# Patient Record
Sex: Male | Born: 1955 | Race: White | Hispanic: No | Marital: Married
Health system: Southern US, Community
[De-identification: ages and names within clinical notes are randomized; demographics above are authoritative.]

---

## 2009-05-03 ENCOUNTER — Ambulatory Visit: Payer: Self-pay | Admitting: Internal Medicine

## 2014-08-18 ENCOUNTER — Ambulatory Visit
Admit: 2014-08-18 | Disposition: A | Discharge status: Other Acute Inpatient Hospital | Payer: Self-pay | Attending: Family Medicine | Admitting: Family Medicine

## 2014-08-18 ENCOUNTER — Observation Stay
Admit: 2014-08-18 | Disposition: A | Discharge status: Home / Self Care | Payer: Self-pay | Attending: Internal Medicine | Admitting: Internal Medicine

## 2014-08-18 LAB — COMPREHENSIVE METABOLIC PANEL
ALK PHOS: 78 U/L
ALT: 27 U/L
ANION GAP: 6 — AB (ref 7–16)
Albumin: 4 g/dL
BUN: 13 mg/dL
Bilirubin,Total: 0.8 mg/dL
CO2: 25 mmol/L
CREATININE: 0.8 mg/dL
Calcium, Total: 8.4 mg/dL — ABNORMAL LOW
Chloride: 104 mmol/L
EGFR (African American): >60
EGFR (Non-African Amer.): >60
Glucose: 117 mg/dL — ABNORMAL HIGH
POTASSIUM: 3.9 mmol/L
SGOT(AST): 27 U/L
Sodium: 135 mmol/L
Total Protein: 7.1 g/dL

## 2014-08-18 LAB — URINALYSIS, COMPLETE
Bacteria: NONE SEEN
Bilirubin,UR: NEGATIVE
Blood: NEGATIVE
Glucose,UR: NEGATIVE mg/dL (ref 0–75)
Ketone: NEGATIVE
Leukocyte Esterase: NEGATIVE
Nitrite: NEGATIVE
PROTEIN: NEGATIVE
Ph: 5 (ref 4.5–8.0)
Specific Gravity: 1.02 (ref 1.003–1.030)

## 2014-08-18 LAB — APTT: ACTIVATED PTT: 31.4 s (ref 23.6–35.9)

## 2014-08-18 LAB — CBC
HCT: 42.8 % (ref 40.0–52.0)
HGB: 14.2 g/dL (ref 13.0–18.0)
MCH: 30.1 pg (ref 26.0–34.0)
MCHC: 33.1 g/dL (ref 32.0–36.0)
MCV: 91 fL (ref 80–100)
Platelet: 169 10*3/uL (ref 150–440)
RBC: 4.71 10*6/uL (ref 4.40–5.90)
RDW: 13.2 % (ref 11.5–14.5)
WBC: 5.3 10*3/uL (ref 3.8–10.6)

## 2014-08-18 LAB — SEDIMENTATION RATE: ERYTHROCYTE SED RATE: 9 mm/h (ref 0–20)

## 2014-08-18 LAB — PROTIME-INR
INR: 1
Prothrombin Time: 13.2 secs

## 2014-08-18 LAB — TROPONIN I

## 2014-08-19 LAB — BASIC METABOLIC PANEL
ANION GAP: 6 — AB (ref 7–16)
BUN: 13 mg/dL
CALCIUM: 8.5 mg/dL — AB
CO2: 27 mmol/L
Chloride: 104 mmol/L
Creatinine: 0.89 mg/dL
EGFR (African American): >60
EGFR (Non-African Amer.): >60
Glucose: 97 mg/dL
POTASSIUM: 4 mmol/L
Sodium: 137 mmol/L

## 2014-08-19 LAB — CBC WITH DIFFERENTIAL/PLATELET
BASOS ABS: 0 10*3/uL (ref 0.0–0.1)
Basophil %: 0.7 %
Eosinophil #: 0 10*3/uL (ref 0.0–0.7)
Eosinophil %: 1 %
HCT: 41.9 % (ref 40.0–52.0)
HGB: 14 g/dL (ref 13.0–18.0)
LYMPHS ABS: 0.6 10*3/uL — AB (ref 1.0–3.6)
Lymphocyte %: 13.7 %
MCH: 30.4 pg (ref 26.0–34.0)
MCHC: 33.3 g/dL (ref 32.0–36.0)
MCV: 91 fL (ref 80–100)
Monocyte #: 0.5 x10 3/mm (ref 0.2–1.0)
Monocyte %: 12.2 %
NEUTROS ABS: 3 10*3/uL (ref 1.4–6.5)
Neutrophil %: 72.4 %
Platelet: 160 10*3/uL (ref 150–440)
RBC: 4.61 10*6/uL (ref 4.40–5.90)
RDW: 13.4 % (ref 11.5–14.5)
WBC: 4.1 10*3/uL (ref 3.8–10.6)

## 2014-08-19 LAB — INFLUENZA A,B,H1N1 - PCR (ARMC)
H1N1 flu by pcr: NOT DETECTED
Influenza A By PCR: POSITIVE
Influenza B By PCR: NEGATIVE

## 2014-08-20 LAB — CBC WITH DIFFERENTIAL/PLATELET
BASOS PCT: 1 %
Basophil #: 0 10*3/uL (ref 0.0–0.1)
EOS ABS: 0.1 10*3/uL (ref 0.0–0.7)
Eosinophil %: 2.1 %
HCT: 43.7 % (ref 40.0–52.0)
HGB: 14.7 g/dL (ref 13.0–18.0)
LYMPHS ABS: 0.7 10*3/uL — AB (ref 1.0–3.6)
Lymphocyte %: 20.6 %
MCH: 30.5 pg (ref 26.0–34.0)
MCHC: 33.6 g/dL (ref 32.0–36.0)
MCV: 91 fL (ref 80–100)
MONO ABS: 0.5 x10 3/mm (ref 0.2–1.0)
Monocyte %: 14.7 %
NEUTROS ABS: 2.2 10*3/uL (ref 1.4–6.5)
NEUTROS PCT: 61.6 %
Platelet: 161 10*3/uL (ref 150–440)
RBC: 4.81 10*6/uL (ref 4.40–5.90)
RDW: 13.3 % (ref 11.5–14.5)
WBC: 3.6 10*3/uL — ABNORMAL LOW (ref 3.8–10.6)

## 2014-09-06 NOTE — H&P (Signed)
PATIENT NAME:  Adam Hanson, Adam Hanson MR#:  [id number] DATE OF BIRTH:  08/23/1955  DATE OF ADMISSION:  08/18/2014  PRIMARY PHYSICIAN: Marina Goodell, MD  EMERGENCY ROOM PHYSICIAN: Raelyn Ensign, MD   CHIEF COMPLAINT: Headache.   HISTORY OF PRESENT ILLNESS: This patient is a 59 year old male with history of hyperlipidemia and GERD, brought in because of headache. The patient has headache on the top of the head, around 10/10 in severity. It started yesterday and got progressively worse today, associated with some cough. The patient was playing golf and after that he started to have headache, and a because of severe headache he was brought in. The patient also noted to have confusion and also unstable gait. The patient's wife was driving him to the Emergency Room where he tried to reach the steering and he was having stumbling gait and disorientation. Concerning this headache, disorientation, unsteady gait, brought to the Emergency Room. Initial workup is negative. CT head is unremarkable. He was seen by Dr. Mellody Drown, he recommended MRI of the brain along with possible LP and EEG tomorrow morning. So, we are going to put him on observation status. The patient received morphine in the Emergency Room with some relief of headache. He denies blurred vision. No history of seizure disorder. There was some concern that he was having some shaking today, and the patient right now is not having any postictal symptoms. He is completely alert and oriented. The patient has been having severe cough since yesterday, and he thought it could be secondary to cough. The patient has no photophobia.  PAST MEDICAL HISTORY: Significant for hyperlipidemia and GERD.   MEDICATIONS: He is on Lipitor 40 mg daily and duloxetine 40 mg daily.   SOCIAL HISTORY: No smoking, no drinking, works as a Naval architect.   PAST SURGICAL HISTORY: No history of operations.   FAMILY HISTORY: No hypertension or diabetes.  REVIEW OF SYSTEMS:   CONSTITUTIONAL: No fever. No fatigue.  EYES: No blurred vision.  ENT: No tinnitus. No epistaxis.  RESPIRATORY: No cough. No wheezing.  CARDIOVASCULAR: No chest pain, orthopnea. No PND.  GASTROINTESTINAL: No nausea. No vomiting. No abdominal pain.  GENITOURINARY: No dysuria. No hematuria.  ENDOCRINE: No polyuria or nocturia.  HEMATOLOGIC: No anemia.  MUSCULOSKELETAL: No joint pain.  NEUROLOGIC: No numbness now. The patient does have headache but no weakness noted. No tremors. The patient does have ataxia and headache.   PHYSICAL EXAMINATION:  VITAL SIGNS: Temperature 98.8, heart rate 90, blood pressure 131/82, saturating is 98% on room air.  GENERAL: The patient is alert, awake, oriented; 59 year old obese male, not in distress. answering questions  appropriately. HEAD: Atraumatic, normocephalic.  EYES: Pupils equal, reacting to light. Extraocular movements intact.  No scleral icterus. No conjunctivitis.  ENT: No tinnitus, congestion. No turbinate hypertrophy. No oropharyngeal erythema. NECK: Supple. No JVD. No carotid bruits.  RESPIRATIONS: Clear to auscultation. No wheeze, no rales.  CARDIOVASCULAR: S1, S2; regular and rhythm. No murmurs. PMI not displaced with good pedal femoral pulse and good femoral pulse.  ABDOMEN: Soft, nontender, nondistended. Bowel sounds present.  MUSCULOSKELETAL: Strength 5/5 upper and lower extremities.  SKIN: No skin rashes.  NEUROLOGIC: Cranial nerves II-XII intact. DTRs 2+ bilaterally. No dysarthria or aphasia.  PSYCHIATRIC: Motor and affect are within normal limits.   LABORATORY DATA: Chest x-ray shows normal examination. Head CAT scan shows normal findings.   CBC: WBC 5.3, hemoglobin 14.2, hematocrit 42.8, platelets 169,000. The patient's electrolytes:  Sodium is 135, potassium 3.9, chloride 104,  bicarbonate 25, BUN 13, creatinine 0.80, glucose 117.   ASSESSMENT AND PLAN:  A 59 year old male patient with severe headache and altered mental status with  disorientation and ataxia, symptoms concerning for possible normal pressure hydrocephalus versus seizures. Differential also includes possible complicated migraine. His CT is normal. Seen by Dr. Mellody DrownMatthew Smith. We are going to admit him to hospitalist service under observation, get MRA of the brain along with EEG and follow neurological checks.  TIME SPENT: About 55 minutes.   ____________________________ Katha HammingSnehalatha Toran Murch, MD sk:TM D: 08/18/2014 18:45:24 ET T: 08/18/2014 20:33:02 ET JOB#: [id number]  cc: Katha HammingSnehalatha Rozetta Stumpp, MD, <Dictator> Katha HammingSNEHALATHA Maye Parkinson MD ELECTRONICALLY SIGNED 08/19/2014 15:17

## 2014-09-06 NOTE — Discharge Summary (Signed)
PATIENT NAME:  Adam Hanson, Adam Hanson MR#:  [id number] DATE OF BIRTH:  04/01/1956  DATE OF ADMISSION:  08/18/2014 DATE OF DISCHARGE:  08/20/2014  ADMITTING COMPLAINT:  Headache.   DISCHARGE DIAGNOSES:  1.  Influenza A infection.  2.  Headache.  3.  Hyperlipidemia.  4.  Gastroesophageal reflux disease.  5.  Depression.   CONSULTATIONS:  Dr. Mellody DrownMatthew Smith, neurology.   PROCEDURES:  1.  CT of the head without contrast shows normal exam.  No acute findings.  2.  Chest x-ray April 12 shows normal exam.  No pleural effusion, pneumothorax or other acute process.  3.  MRI of the brain without contrast shows no major intracranial arterial occlusion or high-grade proximal stenosis.  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  No definite intracranial aneurysm is identified.  4.  MR venogram of the brain or neck shows an unremarkable head MRV without evidence of venous sinus thrombosis.   HISTORY OF PRESENT ILLNESS: This 59 year old man with past medical history of hyperlipidemia and GERD comes in with a 10 out of 10 headache.  Headache started day prior to admission but became progressively worse with some cough noted.  He states he was playing golf and developed a headache.  He has had some confusion and unstable gait.  He had no other specific symptoms.  CT of the head and on presentation was negative.  He is admitted for further evaluation.  HOSPITAL COURSE BY PROBLEM:  Influenza A causing severe headache:  The patient had a CT scan and MRI, both of which were negative.  He also had an EEG which was negative for any seizure focus.  He was seen by neurology who, after a full workup including the aforementioned tests, suggested that we swab the patient for influenza.  His influenza swab was positive for influenza A and it is likely that this is the cause of his symptoms.  He has had a dry cough with no respiratory distress.  He has had a headache and mild body aches.  He was started  on Tamiflu and at the time of discharge, still feels pressure in the center of his forehead but is feeling overall much better.   DISCHARGE PHYSICAL EXAMINATION:  VITAL SIGNS:  Temperature 98.1, pulse 80, respirations 18, blood pressure 114/74, oxygenation 94% on room air.  GENERAL:  No acute distress.  CARDIOVASCULAR:  Regular rate and rhythm.  No murmurs, rubs, or gallops.  PULMONARY:  Lungs clear to auscultation bilaterally with good air movement.  No wheezes, rhonchi, or rales.  LABORATORY, EKG AND IMAGING DATA:  Sodium 137, potassium 4.0, chloride 104, bicarbonate 27, BUN 13, creatinine 0.89, glucose 97.  LFTs normal at the time of presentation.  Troponin less than 0.03. White blood cells 3.6, hemoglobin 14.7, platelets 161,000, MCV 91.  INR 1.1.  Influenza A is positive.  Urinalysis is negative for signs of infection.  EKG shows normal sinus rhythm.   DISCHARGE MEDICATIONS:  1.  Lipitor 40 mg 1 tablet daily.  2.  Duloxetine 40 mg 1 capsule once a day.  3.  Oseltamivir 75 mg 1 capsule every 12 hours x 5 days.  4.  Acetaminophen/butalbital/caffeine 325 mg/50 mg/40 mg oral capsule, take 2 capsules every 4  hours as needed for headache.   CONDITION ON DISCHARGE:  Stable.   DISPOSITION:  Discharged home with no home health requirements.   DIET: Low fat, low cholesterol diet.   ACTIVITY LIMITATIONS:  None.   TIMEFRAME FOR FOLLOWUP:  1.  Followup within 4 to 6 weeks with [facility].  2.  Follow up within 1 to 2 weeks with primary care.  OTHER INSTRUCTIONS:   1.  Stay hydrated.  2.  Use Tylenol every 6 hours as needed for pain or fever greater than 100.5.   TIME SPENT ON DISCHARGE:  40 minutes.     ____________________________ Ena Dawley. Clent Ridges, MD cpw:[id number] D: 08/24/2014 21:49:27 ET T: 08/24/2014 22:42:22 ET JOB#: [id number]  cc: Ena Dawley. Clent Ridges, MD, <Dictator> Gale Journey MD ELECTRONICALLY SIGNED 08/25/2014 15:08

## 2014-09-06 NOTE — Consult Note (Signed)
Referring Physician:  Raelyn EnsignQuale, [name] :   Primary Care Physician:   Raelyn EnsignQuale, [name] : [facility], [place], [place] [address], [phone]  Reason for Consult: Admit Date: 18-Aug-2014  Chief Complaint: headache  Reason for Consult: headache   History of Present Illness: History of Present Illness:   seen at request of Dr. Fanny BienQuale for headache;  59 yo RHD M [facility] secondary to severe headache 10/10 with nausea and vomiting.  Pt reports playing golf yesterday and not feeling right.  After that he was noted to have a mild headache and felt sleepy.  Pt slept all yesterday which is unusual for him.  He then woke up today with a 10/10 headache.  Headache was the top of the head and not sharp in nature.  There was no radiation noted.  Pt denies neck pain.  Pt reports feeling sleepy all day and overall fatigue.  He did have an episode where he went to sleep and does not remember everything.  He had some confusion as well but no focal weakness or numbness.  ROS:  General fatigue    HEENT no complaints    Lungs cough    Cardiac no complaints    GI no complaints    GU no complaints    Musculoskeletal no complaints    Extremities no complaints    Skin no complaints    Neuro headache    Endocrine no complaints    Psych no complaints    Past Medical/Surgical Hx:  Denies:   Denies:   Past Medical/ Surgical Hx:  Past Medical History HLD   Allergies:  Penicillin: Hives  Allergies:  Allergies PCN    Social/Family History: Employment Status: currently employed; Naval architecttruck driver  Lives With: spouse  Living Arrangements: house  Social History: oral tobacco, no EtOH, no illicits  Family History: no seizures, no stroke   Physical Exam: General: slightly overweight, mild distress  HEENT: normocephalic, sclera nonicteric, oropharynx clear  Neck: supple, no JVD, no bruits;  neg Brudinski and neg Kernig  Chest: CTA B, no wheezing,  good movement  Cardiac: tachycardic, no murmur, 2+ pulses  Extremities: no C/C/E, FROM   Neurologic Exam: Mental Status: alert and oriented x 2 not time, normal speech and language, follows complex commands  Cranial Nerves: PERRLA, EOMI, nl VF, face symmetric, tongue midline, shoulder shrug equal  Motor Exam: 5/5 B normal, tone, no tremor  Deep Tendon Reflexes: 2+/4 B, plantars downgoing B, no Hoffman  Sensory Exam: pinprick, temperature, and vibration intact B  Coordination: FTN and HTS WNL, nl RAM, nl gait   Lab Results:  Routine Coag:  12-Apr-16 16:24   Activated PTT (APTT) 31.4 (A HCT value >55% may artifactually increase the APTT. In one study, the increase was an average of 19%. Reference: "Effect on Routine and Special Coagulation Testing Values of Citrate Anticoagulant Adjustment in Patients with High HCT Values." American Journal of Clinical Pathology 2006;126:400-405.)  Prothrombin 13.2 (11.4-15.0 NOTE: New Reference Range  06/05/14)  INR 1.0 (INR reference interval applies to patients on anticoagulant therapy. A single INR therapeutic range for coumarins is not optimal for all indications; however, the suggested range for most indications is 2.0 - 3.0. Exceptions to the INR Reference Range may include: Prosthetic heart valves, acute myocardial infarction, prevention of myocardial infarction, and combinations of aspirin and anticoagulant. The need for a higher or lower target INR must be assessed individually. Reference: The Pharmacology and Management  of the Vitamin K  antagonists: the seventh ACCP Conference on Antithrombotic and Thrombolytic Therapy. Chest.2004 Sept:126 (3suppl): L7870634. A HCT value >55% may artifactually increase the PT.  In one study,  the increase was an average of 25%. Reference:  "Effect on Routine and Special Coagulation Testing Values of Citrate Anticoagulant Adjustment in Patients with High HCT Values." American Journal of Clinical  Pathology 2006;126:400-405.)  Routine Hem:  12-Apr-16 16:24   WBC (CBC) 5.3  RBC (CBC) 4.71  Hemoglobin (CBC) 14.2  Hematocrit (CBC) 42.8  Platelet Count (CBC) 169 (Result(s) reported on 18 Aug 2014 at 05:03PM.)  MCV 91  MCH 30.1  MCHC 33.1  RDW 13.2   Radiology Results: CT:    12-Apr-16 16:28, CT Head Without Contrast  CT Head Without Contrast   REASON FOR EXAM:    SUDDEN, severe, worst headache of life started last   PM, speech thick today. STAT  COMMENTS:   May transport without cardiac monitor    PROCEDURE: CT  - CT HEAD WITHOUT CONTRAST  - Aug 18 2014  4:28PM     CLINICAL DATA:  Severe frontal headache since last night, code  stroke, sudden onset of severe worst headache of life    EXAM:  CT HEAD WITHOUT CONTRAST    TECHNIQUE:  Contiguous axial images were obtained from the base of the skull  through the vertex without intravenous contrast.  COMPARISON:  None    FINDINGS:  Normal ventricular morphology.    No midline shift or mass effect.    Normal appearance of brain parenchyma.    No intracranial hemorrhage, mass lesion, or acute infarction.    Visualized paranasal sinuses and mastoid air cells clear.    Bones unremarkable.   IMPRESSION:  Normal exam.    Findings called to Dr. Fanny Bien on 08/18/2014 at 1635 hours.      Electronically Signed    By: Ulyses Southward M.D.    On: 08/18/2014 16:35         Verified By: Lollie Marrow, M.D.,   Radiology Impression: Radiology Impression: CT of head personally reviewed by me and is normal   Impression/Recommendations: Recommendations:   labs reviewed by me d/w Dr. Fanny Bien   Headache-  currently there is concern for secondary cause which is mainly seizure given loss of memory and behavior changes;  this could also be a subarachnoid hemorrhage with neg CT of head however there are no menigeal signs to support this;  r/o brain mass as well MRI of brain MRA/V of brain EEG toradol  IV now, magnesium 1gm IV now  and reglan  IV now continue IV fluids at 75cc/hr will follow tomorrow but may be able to go home if w/u neg  Electronic Signatures: Meryl Dare (MD)  (Signed 12-Apr-16 17:48)  Authored: REFERRING PHYSICIAN, Primary Care Physician, Consult, History of Present Illness, Review of Systems, PAST MEDICAL/SURGICAL HISTORY, HOME MEDICATIONS, ALLERGIES, Social/Family History, Physical Exam-, LAB RESULTS, RADIOLOGY RESULTS, Recommendations   Last Updated: 12-Apr-16 17:48 by Meryl Dare (MD)

## 2015-12-24 IMAGING — CT CT HEAD WITHOUT CONTRAST
1 series · 16 of 30 positions shown, 20 images · non-contrast
Comparison: None

CLINICAL DATA: Severe frontal headache since last night, code
stroke, sudden onset of severe worst headache of life

EXAM:
CT HEAD WITHOUT CONTRAST
TECHNIQUE: Contiguous axial images were obtained from the base of the skull
through the vertex without intravenous contrast.

[Series 2: head wo · axial · 0.41mm/px · z∈[-618,-478]mm · 16 of 32 slices shown, 20 images]
[im 2/32  brain]
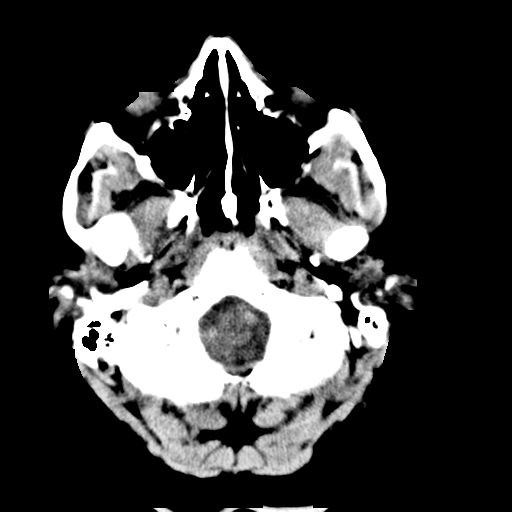
[im 2/32  bone]
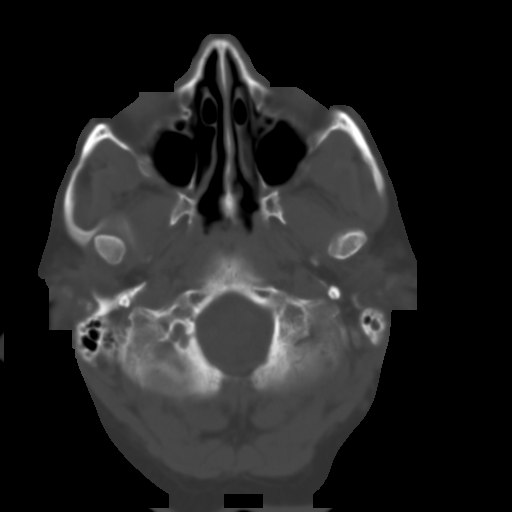
[im 4/32  brain]
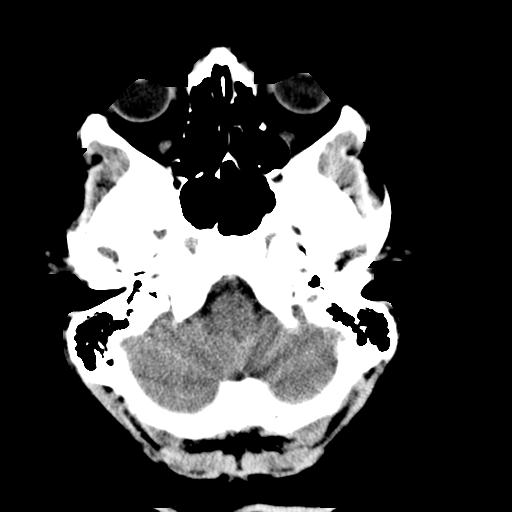
[im 6/32  brain]
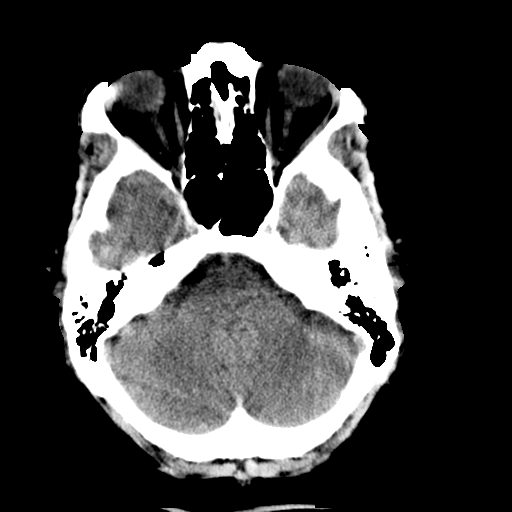
[im 8/32  brain]
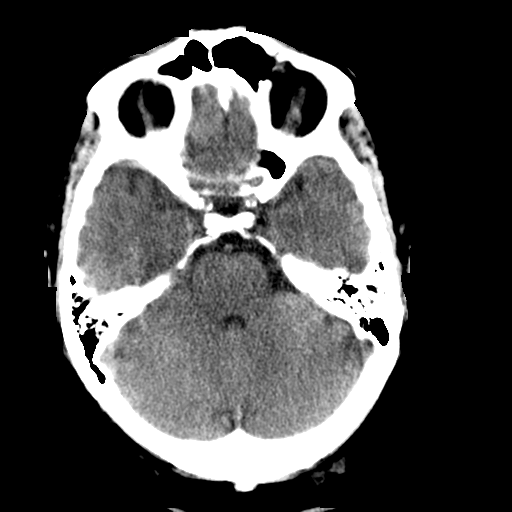
[im 9/32  brain]
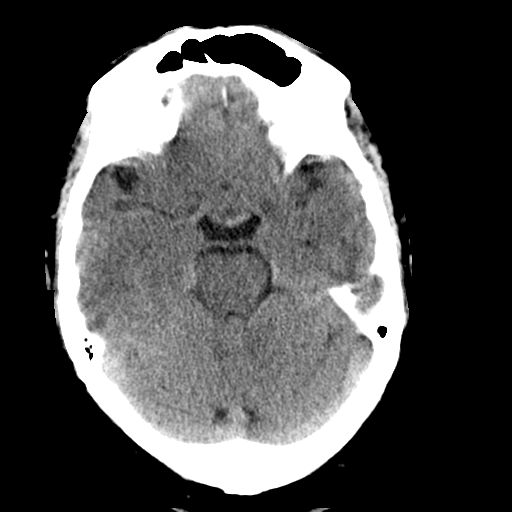
[im 9/32  bone]
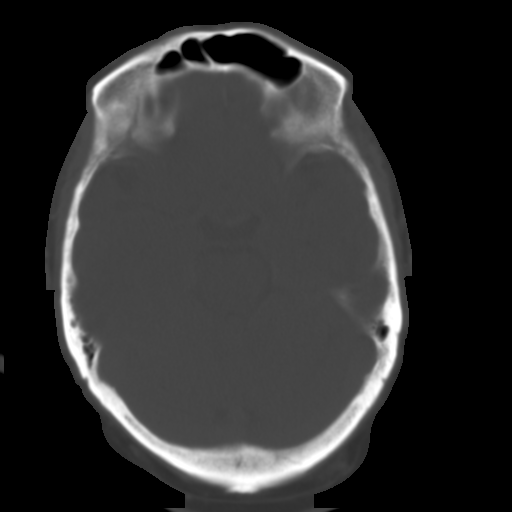
[im 11/32  brain]
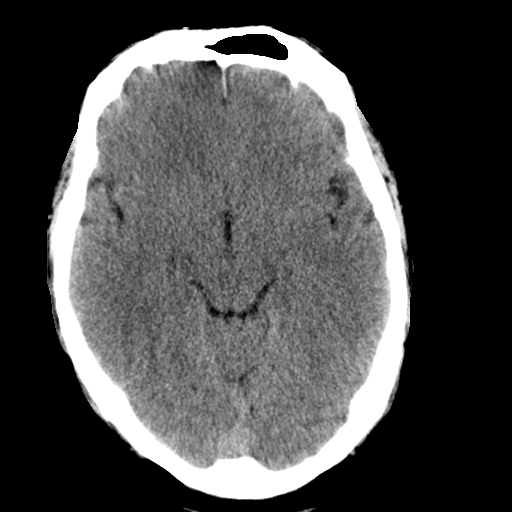
[im 13/32  brain]
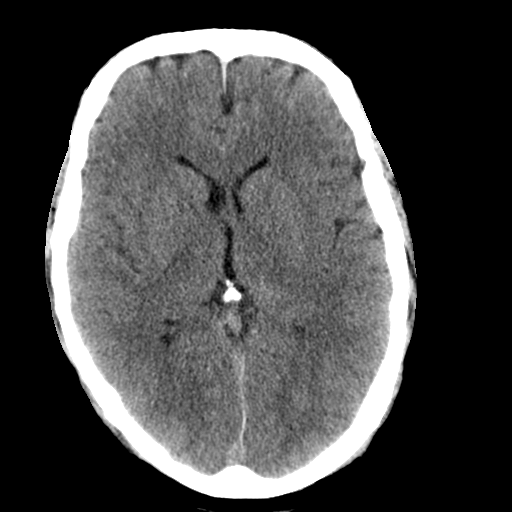
[im 15/32  brain]
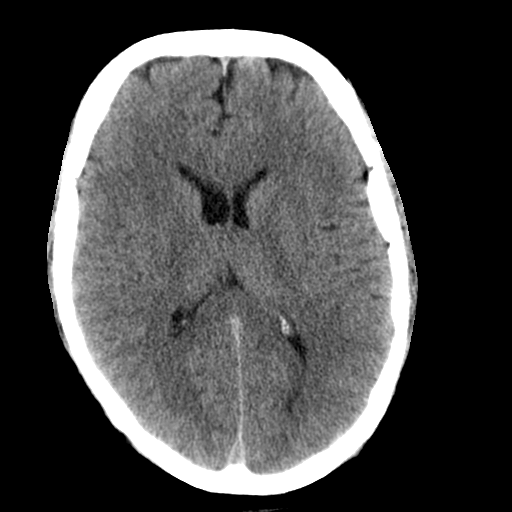
[im 17/32  brain]
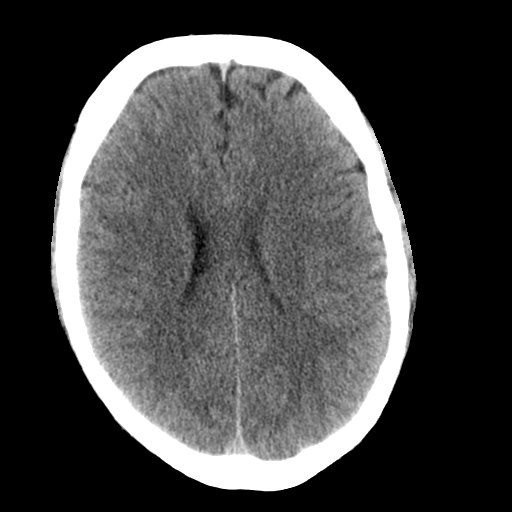
[im 17/32  bone]
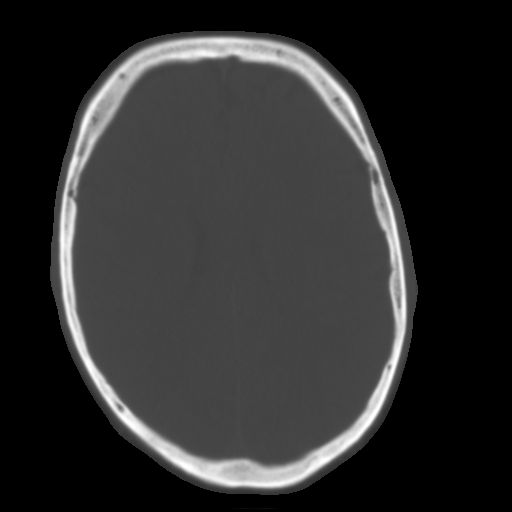
[im 19/32  brain]
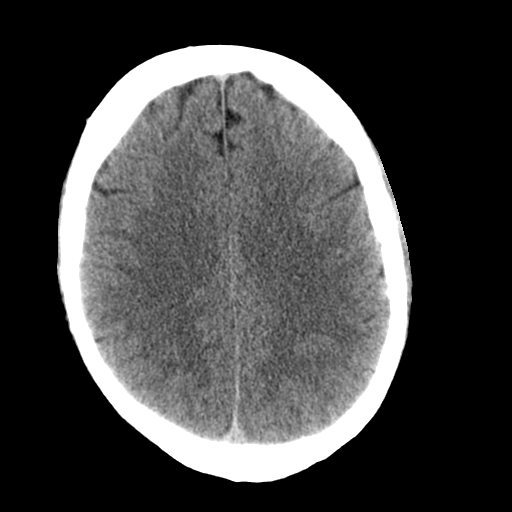
[im 21/32  brain]
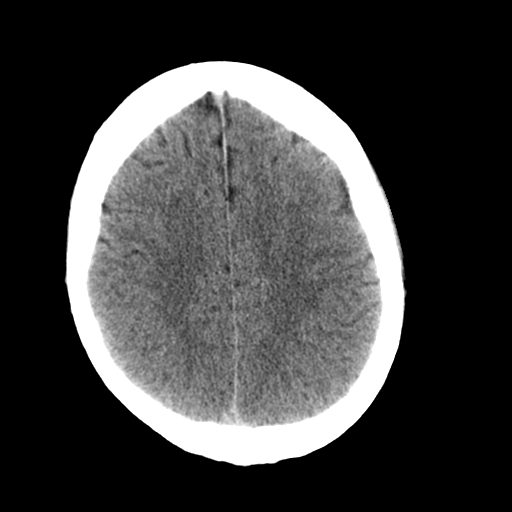
[im 23/32  brain]
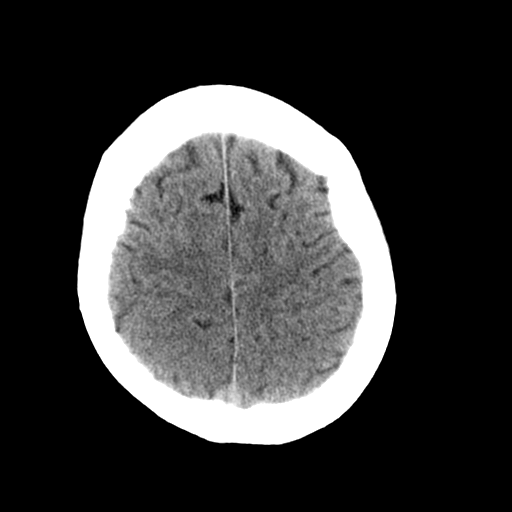
[im 24/32  brain]
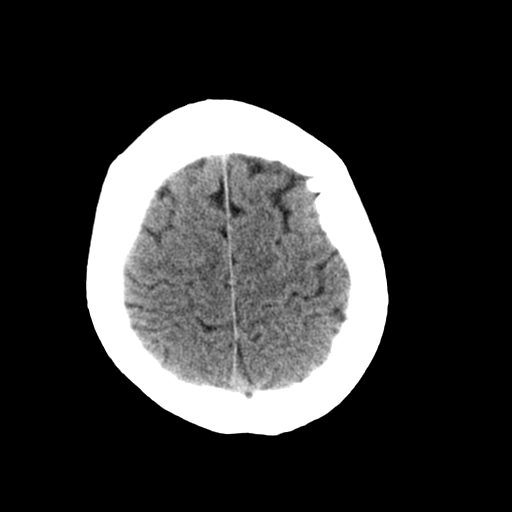
[im 24/32  bone]
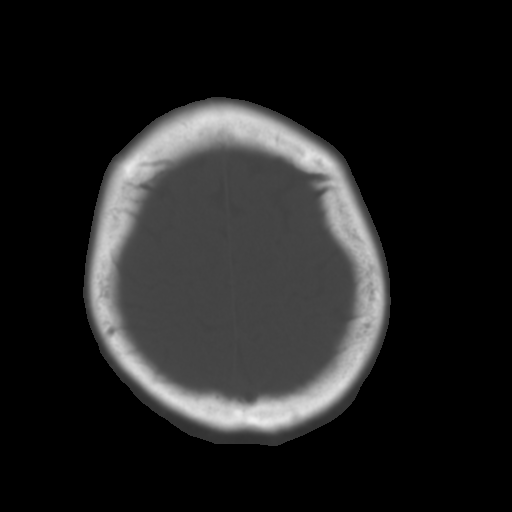
[im 26/32  brain]
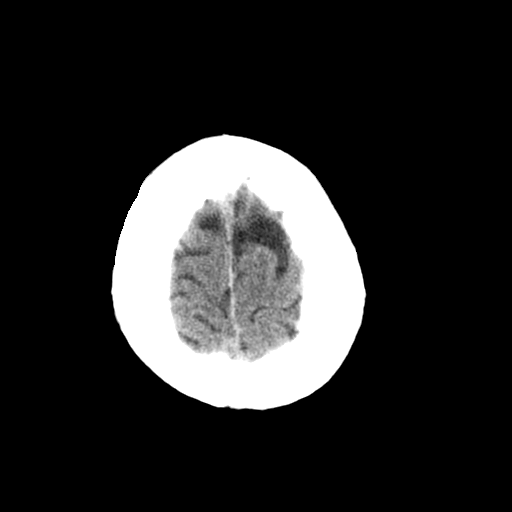
[im 28/32  brain]
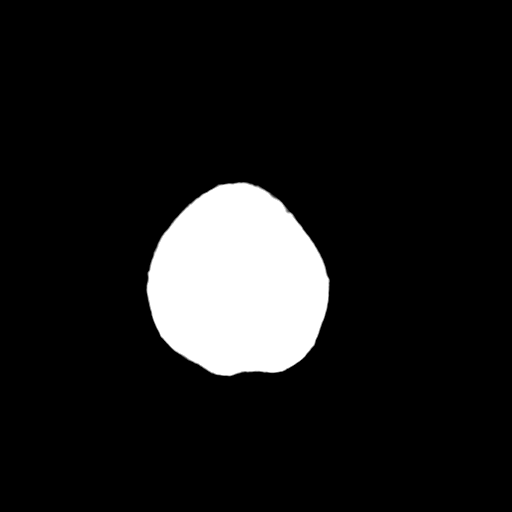
[im 30/32  brain]
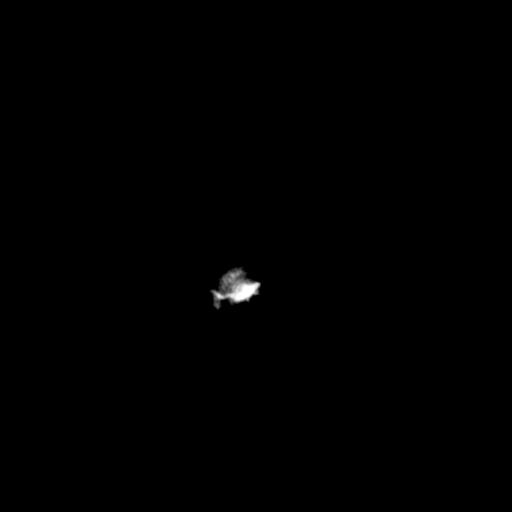

[16 of 30 positions shown; findings below may reference images not displayed]

FINDINGS: Normal ventricular morphology.

No midline shift or mass effect.

Normal appearance of brain parenchyma.

No intracranial hemorrhage, mass lesion, or acute infarction.

Visualized paranasal sinuses and mastoid air cells clear.

Bones unremarkable.
IMPRESSION: Normal exam.

Findings called to Dr. Bielai on 08/18/2014 at 3602 hours.
